# Patient Record
Sex: Male | Born: 1955 | Race: White | Hispanic: No | Marital: Married | Smoking: Never smoker
Health system: Southern US, Community
[De-identification: ages and names within clinical notes are randomized; demographics above are authoritative.]

## PROBLEM LIST (undated history)

## (undated) DIAGNOSIS — K635 Polyp of colon: Secondary | ICD-10-CM

## (undated) DIAGNOSIS — G8929 Other chronic pain: Secondary | ICD-10-CM

## (undated) DIAGNOSIS — E785 Hyperlipidemia, unspecified: Secondary | ICD-10-CM

## (undated) DIAGNOSIS — G4730 Sleep apnea, unspecified: Secondary | ICD-10-CM

## (undated) DIAGNOSIS — Z8669 Personal history of other diseases of the nervous system and sense organs: Secondary | ICD-10-CM

## (undated) DIAGNOSIS — R531 Weakness: Secondary | ICD-10-CM

## (undated) DIAGNOSIS — M549 Dorsalgia, unspecified: Secondary | ICD-10-CM

## (undated) DIAGNOSIS — K219 Gastro-esophageal reflux disease without esophagitis: Secondary | ICD-10-CM

## (undated) DIAGNOSIS — C801 Malignant (primary) neoplasm, unspecified: Secondary | ICD-10-CM

## (undated) HISTORY — DX: Polyp of colon: K63.5

## (undated) HISTORY — PX: COLONOSCOPY: SHX174

---

## 1988-08-12 HISTORY — PX: INGUINAL HERNIA REPAIR: SUR1180

## 2006-08-12 DIAGNOSIS — K635 Polyp of colon: Secondary | ICD-10-CM

## 2006-08-12 HISTORY — PX: COLON RESECTION: SHX5231

## 2006-08-12 HISTORY — DX: Polyp of colon: K63.5

## 2007-08-13 HISTORY — PX: OTHER SURGICAL HISTORY: SHX169

## 2010-08-12 HISTORY — PX: OTHER SURGICAL HISTORY: SHX169

## 2014-08-31 ENCOUNTER — Other Ambulatory Visit: Payer: Self-pay

## 2014-08-31 ENCOUNTER — Other Ambulatory Visit: Payer: Self-pay | Admitting: Neurosurgery

## 2014-09-01 ENCOUNTER — Telehealth: Payer: Self-pay | Admitting: Vascular Surgery

## 2014-09-01 NOTE — Telephone Encounter (Deleted)
-----   Message from Sherrye Payor, RN sent at 08/31/2014  3:28 PM EST ----- Regarding: needs consult with TFE Please schedule this pt. for a consult with Dr. Donnetta Hutching, prior to ALIF sched. for 09/23/14; please remind pt. to bring a CD ROM of his LS spine film to the consultation appt.

## 2014-09-01 NOTE — Telephone Encounter (Addendum)
-----   Message from Sherrye Payor, RN sent at 08/31/2014  3:28 PM EST ----- Regarding: needs consult with TFE Please schedule this pt. for a consult with Dr. Donnetta Hutching, prior to ALIF sched. for 09/23/14; please remind pt. to bring a CD ROM of his LS spine film to the consultation appt.  notified patient of appt. with dr. early on 09-14-14 at 2:45 and reminded him to bring CD ROM of l/s spine

## 2014-09-13 ENCOUNTER — Encounter: Payer: Self-pay | Admitting: Vascular Surgery

## 2014-09-14 ENCOUNTER — Encounter: Payer: Self-pay | Admitting: Vascular Surgery

## 2014-09-14 ENCOUNTER — Ambulatory Visit (INDEPENDENT_AMBULATORY_CARE_PROVIDER_SITE_OTHER): Payer: BLUE CROSS/BLUE SHIELD | Admitting: Vascular Surgery

## 2014-09-14 VITALS — BP 149/94 | HR 71 | Resp 18 | Ht 77.0 in | Wt 217.7 lb

## 2014-09-14 DIAGNOSIS — M5136 Other intervertebral disc degeneration, lumbar region: Secondary | ICD-10-CM

## 2014-09-14 NOTE — Progress Notes (Signed)
Patient name: Leon Robinson MRN: [id number] DOB: Dec 04, 1955 Sex: male   Referred by: Vertell Limber  Reason for referral:  Chief Complaint  Patient presents with  . New Evaluation    ALIF consult    HISTORY OF PRESENT ILLNESS: The patient presents today for discussion of anterior exposure for L5-S1 disc surgery. He is a very pleasant 59 year old gentleman with severe degenerative disc disease. He is status post posterior L5-S1 disc surgery in 2012. He has had recurrent disc issues with pain and atrophy in his left leg related to S1 root compression. He is scheduled for anterior exposure for disc surgery on 09/23/2014. Past history is also significant for colon cancer. He presented with blood in the stool and evaluation revealed a polyp. This was resected and then he subsequently underwent formal colon resection showing no evidence of disease. This was in 2008 with no evidence of recurrence. He does have a midline lower incision for this surgery. He has no history of cardiac disease and no history of peripheral vascular occlusive disease.  Past Medical History  Diagnosis Date  . Colon polyps 2008    cancerous colon polyps     Past Surgical History  Procedure Laterality Date  . Deviated septum repair  2009  . Colon resection  2008  . Spinal decompression surgery  2012    L5, S1  . Inguinal hernia repair  1990    History   Social History  . Marital Status: Married    Spouse Name: N/A    Number of Children: N/A  . Years of Education: N/A   Occupational History  . Not on file.   Social History Main Topics  . Smoking status: Never Smoker   . Smokeless tobacco: Not on file  . Alcohol Use: Yes     Comment: occassional social drinker  . Drug Use: No  . Sexual Activity: Not on file   Other Topics Concern  . Not on file   Social History Narrative  . No narrative on file    Family History  Problem Relation Age of Onset  . Heart disease Mother   . Hyperlipidemia Mother    . Hypertension Mother   . Diabetes Father   . Heart disease Father   . Hyperlipidemia Father   . Cancer Sister     Allergies as of 09/14/2014 - Review Complete 09/14/2014  Allergen Reaction Noted  . Dilaudid [hydromorphone hcl] Nausea And Vomiting 09/13/2014    No current outpatient prescriptions on file prior to visit.   No current facility-administered medications on file prior to visit.     REVIEW OF SYSTEMS:  Positives indicated with an "X"  CARDIOVASCULAR:  [ ]  chest pain   [ ]  chest pressure   [ ]  palpitations   [ ]  orthopnea   [ ]  dyspnea on exertion   [ ]  claudication   [ ]  rest pain   [ ]  DVT   [ ]  phlebitis PULMONARY:   [ ]  productive cough   [ ]  asthma   [ ]  wheezing NEUROLOGIC:   [ ]  weakness  [ ]  paresthesias  [ ]  aphasia  [ ]  amaurosis  [ ]  dizziness HEMATOLOGIC:   [ ]  bleeding problems   [ ]  clotting disorders MUSCULOSKELETAL:  [ ]  joint pain   [ ]  joint swelling GASTROINTESTINAL: [ ]   blood in stool  [ ]   hematemesis GENITOURINARY:  [ ]   dysuria  [ ]   hematuria PSYCHIATRIC:  [ ]  history  of major depression INTEGUMENTARY:  [ ]  rashes  [ ]  ulcers CONSTITUTIONAL:  [ ]  fever   [ ]  chills  PHYSICAL EXAMINATION:  General: The patient is a well-nourished male, in no acute distress. Vital signs are BP 149/94 mmHg  Pulse 71  Resp 18  Ht 6\' 5"  (1.956 m)  Wt 217 lb 11.2 oz (98.748 kg)  BMI 25.81 kg/m2 Pulmonary: There is a good air exchange bilaterally  Abdomen: Soft and non-tender. No masses noted. Does have a well-healed lower midline incision from his umbilicus to his perineum. Musculoskeletal: There are no major deformities.  There is no significant extremity pain. Neurologic: No focal weakness or paresthesias are detected, Skin: There are no ulcer or rashes noted. Psychiatric: The patient has normal affect. Cardiovascular: 2+ radial and 2+ dorsalis pedis pulses bilaterally   I did review his MRI which does not show any significant occlusive disease of  his iliac arteries   Impression and Plan:  L5 S1 disc disease with plan for anterior exposure. I did explain my role for anterior exposure. Explain mobilization of the rectus muscle, left ureter, intraperitoneal contents, iliac arteries and veins. I did explain the potential for injury to all these. Also explained the potential for a retrograde ejaculation. I also explained that with his prior colon surgery and with his prior L5-S1 disc disease that it is variable is amount of scarring is present tissue making injury somewhat more possible and surgery somewhat more difficult. I do feel that he is an acceptable risk for surgery. All his questions were answered and will plan on proceeding with surgery as planned on 09/23/2014 with Dr. Vertell Limber    Taniya Dasher [facility] Office: [phone]

## 2014-09-15 ENCOUNTER — Encounter (HOSPITAL_COMMUNITY): Payer: Self-pay

## 2014-09-15 ENCOUNTER — Encounter (HOSPITAL_COMMUNITY)
Admission: RE | Admit: 2014-09-15 | Discharge: 2014-09-15 | Disposition: A | Discharge status: Home / Self Care | Payer: BLUE CROSS/BLUE SHIELD | Source: Ambulatory Visit | Attending: Neurosurgery | Admitting: Neurosurgery

## 2014-09-15 DIAGNOSIS — Z0183 Encounter for blood typing: Secondary | ICD-10-CM | POA: Insufficient documentation

## 2014-09-15 DIAGNOSIS — Z01812 Encounter for preprocedural laboratory examination: Secondary | ICD-10-CM | POA: Insufficient documentation

## 2014-09-15 DIAGNOSIS — M4310 Spondylolisthesis, site unspecified: Secondary | ICD-10-CM | POA: Diagnosis not present

## 2014-09-15 DIAGNOSIS — M47896 Other spondylosis, lumbar region: Secondary | ICD-10-CM | POA: Insufficient documentation

## 2014-09-15 HISTORY — DX: Gastro-esophageal reflux disease without esophagitis: K21.9

## 2014-09-15 HISTORY — DX: Personal history of other diseases of the nervous system and sense organs: Z86.69

## 2014-09-15 HISTORY — DX: Malignant (primary) neoplasm, unspecified: C80.1

## 2014-09-15 HISTORY — DX: Other chronic pain: G89.29

## 2014-09-15 HISTORY — DX: Weakness: R53.1

## 2014-09-15 HISTORY — DX: Sleep apnea, unspecified: G47.30

## 2014-09-15 HISTORY — DX: Hyperlipidemia, unspecified: E78.5

## 2014-09-15 HISTORY — DX: Dorsalgia, unspecified: M54.9

## 2014-09-15 LAB — BASIC METABOLIC PANEL
Anion gap: 7 (ref 5–15)
BUN: 20 mg/dL (ref 6–23)
CO2: 28 mmol/L (ref 19–32)
Calcium: 9.8 mg/dL (ref 8.4–10.5)
Chloride: 106 mmol/L (ref 96–112)
Creatinine, Ser: 1.12 mg/dL (ref 0.50–1.35)
GFR calc non Af Amer: 71 mL/min — ABNORMAL LOW (ref >90)
GFR, EST AFRICAN AMERICAN: 82 mL/min — AB (ref >90)
GLUCOSE: 101 mg/dL — AB (ref 70–99)
POTASSIUM: 4.3 mmol/L (ref 3.5–5.1)
Sodium: 141 mmol/L (ref 135–145)

## 2014-09-15 LAB — CBC
HEMATOCRIT: 44.7 % (ref 39.0–52.0)
Hemoglobin: 15.8 g/dL (ref 13.0–17.0)
MCH: 29.2 pg (ref 26.0–34.0)
MCHC: 35.3 g/dL (ref 30.0–36.0)
MCV: 82.6 fL (ref 78.0–100.0)
PLATELETS: 149 10*3/uL — AB (ref 150–400)
RBC: 5.41 MIL/uL (ref 4.22–5.81)
RDW: 13.2 % (ref 11.5–15.5)
WBC: 4.9 10*3/uL (ref 4.0–10.5)

## 2014-09-15 LAB — TYPE AND SCREEN
ABO/RH(D): A POS
ANTIBODY SCREEN: NEGATIVE

## 2014-09-15 LAB — SURGICAL PCR SCREEN
MRSA, PCR: NEGATIVE
Staphylococcus aureus: NEGATIVE

## 2014-09-15 LAB — ABO/RH: ABO/RH(D): A POS

## 2014-09-15 MED ORDER — CHLORHEXIDINE GLUCONATE 4 % EX LIQD
60.0000 mL | Freq: Once | CUTANEOUS | Status: DC
Start: 2014-09-16 — End: 2014-09-16

## 2014-09-15 MED ORDER — CHLORHEXIDINE GLUCONATE 4 % EX LIQD: 60.0000 mL | Freq: Once | CUTANEOUS | Status: DC

## 2014-09-15 NOTE — Progress Notes (Signed)
Pt doesn't have a cardiologist  Stress test done 3+yrs ago in [facility] [phone] request along with echo  Denies ever having a heart cath  EKG and CXR to be requested from [facility]

## 2014-09-15 NOTE — Pre-Procedure Instructions (Signed)
Leon Robinson  09/15/2014   Your procedure is scheduled on:  Fri, Feb 12 @ 7:30 AM  Report to [facility]  at 5:30 AM.  Call this number if you have problems the morning of surgery: [phone]   Remember:   Do not eat food or drink liquids after midnight.   Take these medicines the morning of surgery with A SIP OF WATER: Cymbalta(Duloxetine) and Aciphex(Rabeprazole)              Stop taking your Aspirin. No Goody's,BC's,Aleve,Ibuprofen,Fish Oil,or any Herbal Medications   Do not wear jewelry  Do not wear lotions, powders, or colognes. You may wear deodorant.  Men may shave face and neck.  Do not bring valuables to the hospital.  [facility] is not responsible                  for any belongings or valuables.               Contacts, dentures or bridgework may not be worn into surgery.  Leave suitcase in the car. After surgery it may be brought to your room.  For patients admitted to the hospital, discharge time is determined by your                treatment team.               Patients discharged the day of surgery will not be allowed to drive  home.    Special Instructions: Shower using CHG 2 nights before surgery and the night before surgery.  If you shower the day of surgery use CHG.  Use special wash - you have one bottle of CHG for all showers.  You should use approximately 1/3 of the bottle for each shower.   Please read over the following fact sheets that you were given: Pain Booklet, Coughing and Deep Breathing, Blood Transfusion Information, MRSA Information and Surgical Site Infection Prevention

## 2014-09-15 NOTE — Progress Notes (Signed)
[facility] is where sleep study was done about 16yrs ago

## 2014-09-15 NOTE — Progress Notes (Deleted)
Medical Md is Dr.Richard Minna Antis

## 2014-09-20 NOTE — Progress Notes (Signed)
Spoke with Dr [facility] office in [place] [phone] and re-requested medical records be sent via fax as they stated they put the records in the mail. ( CXR, EKG, ECHO, stress test sleep study, OV notes)

## 2014-09-22 MED ORDER — CEFAZOLIN SODIUM-DEXTROSE 2-3 GM-% IV SOLR
2.0000 g | INTRAVENOUS | Status: AC
  Administered 2014-09-23 (×2): 2 g via INTRAVENOUS
  Filled 2014-09-22: qty 50

## 2014-09-23 ENCOUNTER — Inpatient Hospital Stay (HOSPITAL_COMMUNITY): Payer: BLUE CROSS/BLUE SHIELD

## 2014-09-23 ENCOUNTER — Inpatient Hospital Stay (HOSPITAL_COMMUNITY): Payer: BLUE CROSS/BLUE SHIELD | Admitting: Anesthesiology

## 2014-09-23 ENCOUNTER — Encounter (HOSPITAL_COMMUNITY)
Admission: RE | Disposition: A | Discharge status: Home / Self Care | Payer: BLUE CROSS/BLUE SHIELD | Source: Ambulatory Visit | Attending: Neurosurgery

## 2014-09-23 ENCOUNTER — Inpatient Hospital Stay (HOSPITAL_COMMUNITY)
Admission: RE | Admit: 2014-09-23 | Discharge: 2014-09-25 | DRG: 454 | Disposition: A | Discharge status: Home / Self Care | Payer: BLUE CROSS/BLUE SHIELD | Source: Ambulatory Visit | Attending: Neurosurgery | Admitting: Neurosurgery

## 2014-09-23 ENCOUNTER — Encounter (HOSPITAL_COMMUNITY): Payer: Self-pay | Admitting: General Practice

## 2014-09-23 DIAGNOSIS — M4005 Postural kyphosis, thoracolumbar region: Secondary | ICD-10-CM | POA: Diagnosis present

## 2014-09-23 DIAGNOSIS — M5127 Other intervertebral disc displacement, lumbosacral region: Principal | ICD-10-CM | POA: Diagnosis present

## 2014-09-23 DIAGNOSIS — M47817 Spondylosis without myelopathy or radiculopathy, lumbosacral region: Secondary | ICD-10-CM | POA: Diagnosis present

## 2014-09-23 DIAGNOSIS — Y658 Other specified misadventures during surgical and medical care: Secondary | ICD-10-CM | POA: Diagnosis not present

## 2014-09-23 DIAGNOSIS — I9752 Accidental puncture and laceration of a circulatory system organ or structure during other procedure: Secondary | ICD-10-CM | POA: Diagnosis not present

## 2014-09-23 DIAGNOSIS — M4807 Spinal stenosis, lumbosacral region: Secondary | ICD-10-CM | POA: Diagnosis present

## 2014-09-23 DIAGNOSIS — M4317 Spondylolisthesis, lumbosacral region: Secondary | ICD-10-CM

## 2014-09-23 DIAGNOSIS — M4326 Fusion of spine, lumbar region: Secondary | ICD-10-CM

## 2014-09-23 DIAGNOSIS — S35516A Injury of unspecified iliac vein, initial encounter: Secondary | ICD-10-CM

## 2014-09-23 DIAGNOSIS — M5489 Other dorsalgia: Secondary | ICD-10-CM | POA: Diagnosis present

## 2014-09-23 DIAGNOSIS — M5117 Intervertebral disc disorders with radiculopathy, lumbosacral region: Secondary | ICD-10-CM | POA: Diagnosis present

## 2014-09-23 HISTORY — PX: ANTERIOR LUMBAR FUSION: SHX1170

## 2014-09-23 HISTORY — PX: ABDOMINAL EXPOSURE: SHX5708

## 2014-09-23 SURGERY — ANTERIOR LUMBAR FUSION 1 LEVEL
Anesthesia: General | Site: Back

## 2014-09-23 MED ORDER — MORPHINE SULFATE 2 MG/ML IJ SOLN
1.0000 mg | INTRAMUSCULAR | Status: DC | PRN
  Administered 2014-09-23 – 2014-09-24 (×9): 2 mg via INTRAVENOUS
  Administered 2014-09-24: 4 mg via INTRAVENOUS
  Filled 2014-09-23 (×6): qty 1
  Filled 2014-09-23: qty 2
  Filled 2014-09-23 (×2): qty 1

## 2014-09-23 MED ORDER — BISACODYL 10 MG RE SUPP: 10.0000 mg | Freq: Every day | RECTAL | Status: DC | PRN

## 2014-09-23 MED ORDER — DEXAMETHASONE SODIUM PHOSPHATE 10 MG/ML IJ SOLN
INTRAMUSCULAR | Status: DC | PRN
  Administered 2014-09-23: 10 mg via INTRAVENOUS

## 2014-09-23 MED ORDER — PROPOFOL 10 MG/ML IV BOLUS
INTRAVENOUS | Status: DC | PRN
  Administered 2014-09-23: 200 mg via INTRAVENOUS

## 2014-09-23 MED ORDER — OXYCODONE-ACETAMINOPHEN 5-325 MG PO TABS
1.0000 | ORAL_TABLET | ORAL | Status: DC | PRN
  Administered 2014-09-25 (×2): 2 via ORAL
  Filled 2014-09-23 (×2): qty 2

## 2014-09-23 MED ORDER — ACETAMINOPHEN 325 MG PO TABS: 650.0000 mg | ORAL_TABLET | ORAL | Status: DC | PRN

## 2014-09-23 MED ORDER — GLYCOPYRROLATE 0.2 MG/ML IJ SOLN
INTRAMUSCULAR | Status: AC
  Filled 2014-09-23: qty 1

## 2014-09-23 MED ORDER — LIDOCAINE-EPINEPHRINE 1 %-1:100000 IJ SOLN
INTRAMUSCULAR | Status: DC | PRN
  Administered 2014-09-23: 5 mL

## 2014-09-23 MED ORDER — SODIUM CHLORIDE 0.9 % IJ SOLN: 3.0000 mL | INTRAMUSCULAR | Status: DC | PRN

## 2014-09-23 MED ORDER — SODIUM CHLORIDE 0.9 % IJ SOLN
3.0000 mL | Freq: Two times a day (BID) | INTRAMUSCULAR | Status: DC
  Administered 2014-09-23 – 2014-09-24 (×2): 3 mL via INTRAVENOUS

## 2014-09-23 MED ORDER — MENTHOL 3 MG MT LOZG: 1.0000 | LOZENGE | OROMUCOSAL | Status: DC | PRN

## 2014-09-23 MED ORDER — MIDAZOLAM HCL 5 MG/5ML IJ SOLN
INTRAMUSCULAR | Status: DC | PRN
  Administered 2014-09-23: 2 mg via INTRAVENOUS

## 2014-09-23 MED ORDER — CEFAZOLIN SODIUM-DEXTROSE 2-3 GM-% IV SOLR
INTRAVENOUS | Status: AC
  Filled 2014-09-23: qty 50

## 2014-09-23 MED ORDER — MIDAZOLAM HCL 2 MG/2ML IJ SOLN
INTRAMUSCULAR | Status: AC
  Filled 2014-09-23: qty 2

## 2014-09-23 MED ORDER — EPHEDRINE SULFATE 50 MG/ML IJ SOLN
INTRAMUSCULAR | Status: AC
  Filled 2014-09-23: qty 1

## 2014-09-23 MED ORDER — BUPIVACAINE HCL (PF) 0.5 % IJ SOLN
INTRAMUSCULAR | Status: DC | PRN
  Administered 2014-09-23: 5 mL

## 2014-09-23 MED ORDER — DULOXETINE HCL 60 MG PO CPEP
60.0000 mg | ORAL_CAPSULE | Freq: Every day | ORAL | Status: DC
  Administered 2014-09-23 – 2014-09-25 (×3): 60 mg via ORAL
  Filled 2014-09-23 (×3): qty 1

## 2014-09-23 MED ORDER — ONDANSETRON HCL 4 MG/2ML IJ SOLN
INTRAMUSCULAR | Status: DC | PRN
  Administered 2014-09-23: 4 mg via INTRAVENOUS

## 2014-09-23 MED ORDER — LIDOCAINE HCL (CARDIAC) 20 MG/ML IV SOLN
INTRAVENOUS | Status: DC | PRN
  Administered 2014-09-23: 60 mg via INTRAVENOUS

## 2014-09-23 MED ORDER — PANTOPRAZOLE SODIUM 40 MG PO TBEC: 40.0000 mg | DELAYED_RELEASE_TABLET | Freq: Every day | ORAL | Status: DC

## 2014-09-23 MED ORDER — SENNA 8.6 MG PO TABS
1.0000 | ORAL_TABLET | Freq: Two times a day (BID) | ORAL | Status: DC
  Administered 2014-09-24 – 2014-09-25 (×3): 8.6 mg via ORAL
  Filled 2014-09-23 (×3): qty 1

## 2014-09-23 MED ORDER — THROMBIN 20000 UNITS EX SOLR
CUTANEOUS | Status: DC | PRN
  Administered 2014-09-23: 20 mL via TOPICAL

## 2014-09-23 MED ORDER — FENTANYL CITRATE 0.05 MG/ML IJ SOLN
INTRAMUSCULAR | Status: AC
  Filled 2014-09-23: qty 5

## 2014-09-23 MED ORDER — EPHEDRINE SULFATE 50 MG/ML IJ SOLN
INTRAMUSCULAR | Status: DC | PRN
  Administered 2014-09-23 (×2): 10 mg via INTRAVENOUS
  Administered 2014-09-23: 5 mg via INTRAVENOUS
  Administered 2014-09-23: 10 mg via INTRAVENOUS
  Administered 2014-09-23: 15 mg via INTRAVENOUS

## 2014-09-23 MED ORDER — ONDANSETRON HCL 4 MG/2ML IJ SOLN: 4.0000 mg | INTRAMUSCULAR | Status: DC | PRN

## 2014-09-23 MED ORDER — PHENYLEPHRINE HCL 10 MG/ML IJ SOLN
INTRAMUSCULAR | Status: DC | PRN
  Administered 2014-09-23 (×6): 80 ug via INTRAVENOUS

## 2014-09-23 MED ORDER — PANTOPRAZOLE SODIUM 40 MG IV SOLR
40.0000 mg | Freq: Every day | INTRAVENOUS | Status: DC
  Administered 2014-09-23 – 2014-09-24 (×2): 40 mg via INTRAVENOUS
  Filled 2014-09-23 (×2): qty 40

## 2014-09-23 MED ORDER — DOCUSATE SODIUM 100 MG PO CAPS
100.0000 mg | ORAL_CAPSULE | Freq: Two times a day (BID) | ORAL | Status: DC
  Administered 2014-09-23 – 2014-09-25 (×4): 100 mg via ORAL
  Filled 2014-09-23 (×4): qty 1

## 2014-09-23 MED ORDER — PROPOFOL 10 MG/ML IV BOLUS
INTRAVENOUS | Status: AC
  Filled 2014-09-23: qty 20

## 2014-09-23 MED ORDER — ONDANSETRON HCL 4 MG/2ML IJ SOLN
4.0000 mg | INTRAMUSCULAR | Status: DC | PRN
  Administered 2014-09-23 – 2014-09-25 (×8): 4 mg via INTRAVENOUS
  Filled 2014-09-23 (×9): qty 2

## 2014-09-23 MED ORDER — LACTATED RINGERS IV SOLN
INTRAVENOUS | Status: DC | PRN
  Administered 2014-09-23 (×2): via INTRAVENOUS

## 2014-09-23 MED ORDER — FENTANYL CITRATE 0.05 MG/ML IJ SOLN
INTRAMUSCULAR | Status: DC | PRN
  Administered 2014-09-23 (×2): 50 ug via INTRAVENOUS
  Administered 2014-09-23 (×2): 100 ug via INTRAVENOUS

## 2014-09-23 MED ORDER — PHENYLEPHRINE 40 MCG/ML (10ML) SYRINGE FOR IV PUSH (FOR BLOOD PRESSURE SUPPORT)
PREFILLED_SYRINGE | INTRAVENOUS | Status: AC
  Filled 2014-09-23: qty 20

## 2014-09-23 MED ORDER — KCL IN DEXTROSE-NACL 20-5-0.45 MEQ/L-%-% IV SOLN
INTRAVENOUS | Status: DC
  Administered 2014-09-23: 17:00:00 via INTRAVENOUS
  Filled 2014-09-23: qty 1000

## 2014-09-23 MED ORDER — DEXAMETHASONE SODIUM PHOSPHATE 10 MG/ML IJ SOLN
INTRAMUSCULAR | Status: AC
  Filled 2014-09-23: qty 1

## 2014-09-23 MED ORDER — ATORVASTATIN CALCIUM 10 MG PO TABS
20.0000 mg | ORAL_TABLET | Freq: Every day | ORAL | Status: DC
  Administered 2014-09-23 – 2014-09-24 (×2): 20 mg via ORAL
  Filled 2014-09-23 (×2): qty 2

## 2014-09-23 MED ORDER — GLYCOPYRROLATE 0.2 MG/ML IJ SOLN
INTRAMUSCULAR | Status: DC | PRN
  Administered 2014-09-23: 0.2 mg via INTRAVENOUS
  Administered 2014-09-23: .8 mg via INTRAVENOUS

## 2014-09-23 MED ORDER — ALUM & MAG HYDROXIDE-SIMETH 200-200-20 MG/5ML PO SUSP: 30.0000 mL | Freq: Four times a day (QID) | ORAL | Status: DC | PRN

## 2014-09-23 MED ORDER — MENTHOL 3 MG MT LOZG
1.0000 | LOZENGE | OROMUCOSAL | Status: DC | PRN
Start: 2014-09-23 — End: 2014-09-25

## 2014-09-23 MED ORDER — DEXTROSE 5 % IV SOLN
10.0000 mg | INTRAVENOUS | Status: DC | PRN
  Administered 2014-09-23: 20 ug/min via INTRAVENOUS

## 2014-09-23 MED ORDER — ASPIRIN EC 81 MG PO TBEC
81.0000 mg | DELAYED_RELEASE_TABLET | Freq: Every day | ORAL | Status: DC
  Administered 2014-09-24 – 2014-09-25 (×2): 81 mg via ORAL
  Filled 2014-09-23 (×2): qty 1

## 2014-09-23 MED ORDER — ACETAMINOPHEN 650 MG RE SUPP: 650.0000 mg | RECTAL | Status: DC | PRN

## 2014-09-23 MED ORDER — SODIUM CHLORIDE 0.9 % IV SOLN: 250.0000 mL | INTRAVENOUS | Status: DC

## 2014-09-23 MED ORDER — NEOSTIGMINE METHYLSULFATE 10 MG/10ML IV SOLN
INTRAVENOUS | Status: AC
  Filled 2014-09-23: qty 1

## 2014-09-23 MED ORDER — FENTANYL CITRATE 0.05 MG/ML IJ SOLN
25.0000 ug | INTRAMUSCULAR | Status: DC | PRN
  Administered 2014-09-23: 50 ug via INTRAVENOUS

## 2014-09-23 MED ORDER — POLYETHYLENE GLYCOL 3350 17 G PO PACK: 17.0000 g | PACK | Freq: Every day | ORAL | Status: DC | PRN

## 2014-09-23 MED ORDER — FLEET ENEMA 7-19 GM/118ML RE ENEM: 1.0000 | ENEMA | Freq: Once | RECTAL | Status: AC | PRN

## 2014-09-23 MED ORDER — SODIUM CHLORIDE 0.9 % IJ SOLN
3.0000 mL | Freq: Two times a day (BID) | INTRAMUSCULAR | Status: DC
  Administered 2014-09-23 – 2014-09-24 (×3): 3 mL via INTRAVENOUS

## 2014-09-23 MED ORDER — PHENOL 1.4 % MT LIQD: 1.0000 | OROMUCOSAL | Status: DC | PRN

## 2014-09-23 MED ORDER — ARTIFICIAL TEARS OP OINT
TOPICAL_OINTMENT | OPHTHALMIC | Status: DC | PRN
  Administered 2014-09-23: 1 via OPHTHALMIC

## 2014-09-23 MED ORDER — 0.9 % SODIUM CHLORIDE (POUR BTL) OPTIME
TOPICAL | Status: DC | PRN
  Administered 2014-09-23: 1000 mL

## 2014-09-23 MED ORDER — ASPIRIN 81 MG PO TABS: 81.0000 mg | ORAL_TABLET | Freq: Every day | ORAL | Status: DC

## 2014-09-23 MED ORDER — LIDOCAINE HCL (CARDIAC) 20 MG/ML IV SOLN
INTRAVENOUS | Status: AC
  Filled 2014-09-23: qty 5

## 2014-09-23 MED ORDER — ROCURONIUM BROMIDE 100 MG/10ML IV SOLN
INTRAVENOUS | Status: DC | PRN
  Administered 2014-09-23: 10 mg via INTRAVENOUS
  Administered 2014-09-23: 20 mg via INTRAVENOUS
  Administered 2014-09-23 (×2): 10 mg via INTRAVENOUS
  Administered 2014-09-23: 20 mg via INTRAVENOUS
  Administered 2014-09-23: 40 mg via INTRAVENOUS

## 2014-09-23 MED ORDER — NEOSTIGMINE METHYLSULFATE 10 MG/10ML IV SOLN
INTRAVENOUS | Status: DC | PRN
  Administered 2014-09-23: 5 mg via INTRAVENOUS

## 2014-09-23 MED ORDER — LACTATED RINGERS IV SOLN
INTRAVENOUS | Status: DC | PRN
  Administered 2014-09-23 (×3): via INTRAVENOUS

## 2014-09-23 MED ORDER — SODIUM CHLORIDE 0.9 % IJ SOLN
INTRAMUSCULAR | Status: AC
  Filled 2014-09-23: qty 10

## 2014-09-23 MED ORDER — CEFAZOLIN SODIUM 1-5 GM-% IV SOLN: 1.0000 g | Freq: Three times a day (TID) | INTRAVENOUS | Status: DC

## 2014-09-23 MED ORDER — FENTANYL CITRATE 0.05 MG/ML IJ SOLN
INTRAMUSCULAR | Status: AC
  Filled 2014-09-23: qty 2

## 2014-09-23 MED ORDER — ZOLPIDEM TARTRATE 5 MG PO TABS: 5.0000 mg | ORAL_TABLET | Freq: Every evening | ORAL | Status: DC | PRN

## 2014-09-23 MED ORDER — ROCURONIUM BROMIDE 50 MG/5ML IV SOLN
INTRAVENOUS | Status: AC
  Filled 2014-09-23: qty 1

## 2014-09-23 MED ORDER — MORPHINE SULFATE 2 MG/ML IJ SOLN
INTRAMUSCULAR | Status: AC
  Filled 2014-09-23: qty 1

## 2014-09-23 MED ORDER — HYDROCODONE-ACETAMINOPHEN 5-325 MG PO TABS: 1.0000 | ORAL_TABLET | ORAL | Status: DC | PRN

## 2014-09-23 MED ORDER — ROCURONIUM BROMIDE 50 MG/5ML IV SOLN
INTRAVENOUS | Status: AC
  Filled 2014-09-23: qty 2

## 2014-09-23 MED ORDER — DIAZEPAM 5 MG PO TABS
5.0000 mg | ORAL_TABLET | Freq: Four times a day (QID) | ORAL | Status: DC | PRN
  Administered 2014-09-23 – 2014-09-25 (×7): 5 mg via ORAL
  Filled 2014-09-23 (×7): qty 1

## 2014-09-23 MED ORDER — CEFAZOLIN SODIUM 1-5 GM-% IV SOLN
1.0000 g | Freq: Three times a day (TID) | INTRAVENOUS | Status: AC
  Administered 2014-09-23 – 2014-09-24 (×2): 1 g via INTRAVENOUS
  Filled 2014-09-23 (×2): qty 50

## 2014-09-23 MED ORDER — ONDANSETRON HCL 4 MG/2ML IJ SOLN
INTRAMUSCULAR | Status: AC
  Filled 2014-09-23: qty 2

## 2014-09-23 MED ORDER — GLYCOPYRROLATE 0.2 MG/ML IJ SOLN
INTRAMUSCULAR | Status: AC
  Filled 2014-09-23: qty 2

## 2014-09-23 SURGICAL SUPPLY — 117 items
APPLIER CLIP 11 MED OPEN (CLIP) ×8
BENZOIN TINCTURE PRP APPL 2/3 (GAUZE/BANDAGES/DRESSINGS) ×4 IMPLANT
BLADE CLIPPER SURG (BLADE) IMPLANT
BUR BARREL STRAIGHT FLUTE 4.0 (BURR) ×4 IMPLANT
BUR MATCHSTICK NEURO 3.0 LAGG (BURR) ×4 IMPLANT
BUR PRECISION FLUTE 5.0 (BURR) ×4 IMPLANT
CAGE HYPERLORD BRIGADE 8X38X28 (Cage) ×4 IMPLANT
CANISTER SUCT 3000ML PPV (MISCELLANEOUS) ×8 IMPLANT
CLIP APPLIE 11 MED OPEN (CLIP) ×4 IMPLANT
CLOSURE WOUND 1/2 X4 (GAUZE/BANDAGES/DRESSINGS) ×1
CONT SPEC 4OZ CLIKSEAL STRL BL (MISCELLANEOUS) ×8 IMPLANT
COVER BACK TABLE 24X17X13 BIG (DRAPES) IMPLANT
COVER BACK TABLE 60X90IN (DRAPES) ×4 IMPLANT
DECANTER SPIKE VIAL GLASS SM (MISCELLANEOUS) ×8 IMPLANT
DRAPE C-ARM 42X72 X-RAY (DRAPES) ×28 IMPLANT
DRAPE C-ARMOR (DRAPES) ×4 IMPLANT
DRAPE INCISE IOBAN 66X45 STRL (DRAPES) IMPLANT
DRAPE LAPAROTOMY 100X72X124 (DRAPES) ×8 IMPLANT
DRAPE POUCH INSTRU U-SHP 10X18 (DRAPES) ×8 IMPLANT
DRAPE SURG 17X23 STRL (DRAPES) ×4 IMPLANT
DRSG OPSITE POSTOP 4X6 (GAUZE/BANDAGES/DRESSINGS) ×8 IMPLANT
DRSG TELFA 3X8 NADH (GAUZE/BANDAGES/DRESSINGS) ×4 IMPLANT
DURAPREP 26ML APPLICATOR (WOUND CARE) ×8 IMPLANT
ELECT BLADE 4.0 EZ CLEAN MEGAD (MISCELLANEOUS) ×8
ELECT REM PT RETURN 9FT ADLT (ELECTROSURGICAL) ×8
ELECTRODE BLDE 4.0 EZ CLN MEGD (MISCELLANEOUS) ×4 IMPLANT
ELECTRODE REM PT RTRN 9FT ADLT (ELECTROSURGICAL) ×4 IMPLANT
EVACUATOR 1/8 PVC DRAIN (DRAIN) ×4 IMPLANT
GAUZE SPONGE 4X4 12PLY STRL (GAUZE/BANDAGES/DRESSINGS) ×4 IMPLANT
GAUZE SPONGE 4X4 16PLY XRAY LF (GAUZE/BANDAGES/DRESSINGS) IMPLANT
GLOVE BIO SURGEON STRL SZ8 (GLOVE) ×12 IMPLANT
GLOVE BIOGEL PI IND STRL 7.5 (GLOVE) ×2 IMPLANT
GLOVE BIOGEL PI IND STRL 8 (GLOVE) ×4 IMPLANT
GLOVE BIOGEL PI IND STRL 8.5 (GLOVE) ×6 IMPLANT
GLOVE BIOGEL PI INDICATOR 7.5 (GLOVE) ×2
GLOVE BIOGEL PI INDICATOR 8 (GLOVE) ×4
GLOVE BIOGEL PI INDICATOR 8.5 (GLOVE) ×6
GLOVE ECLIPSE 7.5 STRL STRAW (GLOVE) ×4 IMPLANT
GLOVE ECLIPSE 8.0 STRL XLNG CF (GLOVE) ×8 IMPLANT
GLOVE EXAM NITRILE LRG STRL (GLOVE) IMPLANT
GLOVE EXAM NITRILE MD LF STRL (GLOVE) IMPLANT
GLOVE EXAM NITRILE XL STR (GLOVE) IMPLANT
GLOVE EXAM NITRILE XS STR PU (GLOVE) IMPLANT
GLOVE SS BIOGEL STRL SZ 7.5 (GLOVE) ×2 IMPLANT
GLOVE SUPERSENSE BIOGEL SZ 7.5 (GLOVE) ×2
GOWN STRL NON-REIN LRG LVL3 (GOWN DISPOSABLE) ×4 IMPLANT
GOWN STRL REUS W/ TWL LRG LVL3 (GOWN DISPOSABLE) ×4 IMPLANT
GOWN STRL REUS W/ TWL XL LVL3 (GOWN DISPOSABLE) ×12 IMPLANT
GOWN STRL REUS W/TWL 2XL LVL3 (GOWN DISPOSABLE) ×12 IMPLANT
GOWN STRL REUS W/TWL LRG LVL3 (GOWN DISPOSABLE) ×4
GOWN STRL REUS W/TWL XL LVL3 (GOWN DISPOSABLE) ×12
INSERT FOGARTY 61MM (MISCELLANEOUS) IMPLANT
INSERT FOGARTY SM (MISCELLANEOUS) IMPLANT
KIT BASIN OR (CUSTOM PROCEDURE TRAY) ×8 IMPLANT
KIT INFUSE MEDIUM (Orthopedic Implant) ×4 IMPLANT
KIT POSITION SURG JACKSON T1 (MISCELLANEOUS) ×4 IMPLANT
KIT ROOM TURNOVER OR (KITS) ×12 IMPLANT
LIQUID BAND (GAUZE/BANDAGES/DRESSINGS) ×8 IMPLANT
LOOP VESSEL MAXI BLUE (MISCELLANEOUS) IMPLANT
LOOP VESSEL MINI RED (MISCELLANEOUS) IMPLANT
MILL MEDIUM DISP (BLADE) ×4 IMPLANT
NEEDLE ASP BONE MRW 11GX15 J (NEEDLE) ×4 IMPLANT
NEEDLE HYPO 25X1 1.5 SAFETY (NEEDLE) ×8 IMPLANT
NEEDLE SPNL 18GX3.5 QUINCKE PK (NEEDLE) ×8 IMPLANT
NS IRRIG 1000ML POUR BTL (IV SOLUTION) ×8 IMPLANT
PACK LAMINECTOMY NEURO (CUSTOM PROCEDURE TRAY) ×8 IMPLANT
PAD ARMBOARD 7.5X6 YLW CONV (MISCELLANEOUS) ×20 IMPLANT
PATTIES SURGICAL .5 X.5 (GAUZE/BANDAGES/DRESSINGS) IMPLANT
PATTIES SURGICAL .5 X1 (DISPOSABLE) IMPLANT
PATTIES SURGICAL 1X1 (DISPOSABLE) IMPLANT
ROD RELINE-O LORD 5.5X35MM (Rod) ×4 IMPLANT
SCREW 25MM (Screw) ×8 IMPLANT
SCREW LOCK RELINE 5.5 TULIP (Screw) ×8 IMPLANT
SCREW RELINE-O POLY 6.5X50MM (Screw) ×8 IMPLANT
SPONGE INTESTINAL PEANUT (DISPOSABLE) ×8 IMPLANT
SPONGE LAP 18X18 X RAY DECT (DISPOSABLE) ×4 IMPLANT
SPONGE LAP 4X18 X RAY DECT (DISPOSABLE) IMPLANT
SPONGE SURGIFOAM ABS GEL 100 (HEMOSTASIS) ×4 IMPLANT
SPONGE SURGIFOAM ABS GEL SZ50 (HEMOSTASIS) IMPLANT
STAPLER SKIN PROX WIDE 3.9 (STAPLE) IMPLANT
STAPLER VISISTAT 35W (STAPLE) IMPLANT
STRIP BIOACTIVE VITOSS 25X100X (Neuro Prosthesis/Implant) ×8 IMPLANT
STRIP CLOSURE SKIN 1/2X4 (GAUZE/BANDAGES/DRESSINGS) ×3 IMPLANT
SUT MNCRL AB 4-0 PS2 18 (SUTURE) ×4 IMPLANT
SUT PROLENE 4 0 RB 1 (SUTURE) ×8
SUT PROLENE 4-0 RB1 .5 CRCL 36 (SUTURE) ×8 IMPLANT
SUT PROLENE 5 0 C1 (SUTURE) ×4 IMPLANT
SUT PROLENE 5 0 CC1 (SUTURE) ×4 IMPLANT
SUT PROLENE 6 0 C 1 30 (SUTURE) ×4 IMPLANT
SUT PROLENE 6 0 CC (SUTURE) IMPLANT
SUT SILK 0 TIES 10X30 (SUTURE) ×4 IMPLANT
SUT SILK 2 0 TIES 10X30 (SUTURE) ×4 IMPLANT
SUT SILK 2 0 TIES 17X18 (SUTURE) ×2
SUT SILK 2 0SH CR/8 30 (SUTURE) IMPLANT
SUT SILK 2-0 18XBRD TIE BLK (SUTURE) ×2 IMPLANT
SUT SILK 3 0 TIES 10X30 (SUTURE) ×4 IMPLANT
SUT SILK 3 0SH CR/8 30 (SUTURE) IMPLANT
SUT VIC AB 0 CT1 27 (SUTURE)
SUT VIC AB 0 CT1 27XBRD ANBCTR (SUTURE) IMPLANT
SUT VIC AB 1 CT1 18XBRD ANBCTR (SUTURE) ×8 IMPLANT
SUT VIC AB 1 CT1 8-18 (SUTURE) ×8
SUT VIC AB 2-0 CT1 18 (SUTURE) ×12 IMPLANT
SUT VIC AB 2-0 CT1 27 (SUTURE) ×2
SUT VIC AB 2-0 CT1 27XBRD (SUTURE) ×2 IMPLANT
SUT VIC AB 2-0 CT1 36 (SUTURE) ×16 IMPLANT
SUT VIC AB 3-0 SH 27 (SUTURE) ×2
SUT VIC AB 3-0 SH 27X BRD (SUTURE) ×2 IMPLANT
SUT VIC AB 3-0 SH 8-18 (SUTURE) ×16 IMPLANT
SUT VICRYL 4-0 PS2 18IN ABS (SUTURE) IMPLANT
SYR 20ML ECCENTRIC (SYRINGE) ×8 IMPLANT
SYR 3ML LL SCALE MARK (SYRINGE) ×8 IMPLANT
SYR 5ML LL (SYRINGE) IMPLANT
TOWEL OR 17X24 6PK STRL BLUE (TOWEL DISPOSABLE) ×12 IMPLANT
TOWEL OR 17X26 10 PK STRL BLUE (TOWEL DISPOSABLE) ×8 IMPLANT
TRAP SPECIMEN MUCOUS 40CC (MISCELLANEOUS) ×4 IMPLANT
TRAY FOLEY CATH 14FRSI W/METER (CATHETERS) ×4 IMPLANT
WATER STERILE IRR 1000ML POUR (IV SOLUTION) ×8 IMPLANT

## 2014-09-23 NOTE — Progress Notes (Signed)
UR complete.  Adaja Wander RN, MSN 

## 2014-09-23 NOTE — Op Note (Signed)
09/23/2014  1:36 PM  PATIENT:  Leon Robinson  59 y.o. male  PRE-OPERATIVE DIAGNOSIS:  Lumbar radiculopathy, Retrolisthesis of vertebrae, Spondylosis without myelopathy, Stenosis, kyphotic spinal deformity  POST-OPERATIVE DIAGNOSIS:  Lumbar radiculopathy, Retrolisthesis of vertebrae, Spondylosis without myelopathy, Stenosis, kyphotic spinal deformity  PROCEDURE:  Procedure(s) with comments: L5-S1 Anterior Lumbar interbody fusion (Dr. Donnetta Hutching for approach) with  (N/A) - L5-S1 Anterior Lumbar interbody fusion with Posterior redo decompression/Left facetectomy/Rt side pedicle screws and rod with posterolateral arthrodesis Posterior redo decompression/Left facetectomy/Rt side pedicle screws and rod (N/A) - Posterior redo decompression/Left facetectomy/Rt side pedicle screws and rod  Correction of kyphotic spinal deformity  ABDOMINAL EXPOSURE (N/A) - ABDOMINAL EXPOSURE  SURGEON:  Surgeon(s) and Role: Panel 1:    * Erline Levine, MD - Primary    * Angelia Mould, MD - Assisting  Panel 2:    * Rosetta Posner, MD - Primary  PHYSICIAN ASSISTANT:   ASSISTANTS: Poteat, RN   ANESTHESIA:   general  EBL:  Total I/O In: 34 [I.V.:3300] Out: 750 [Urine:475; Blood:275]  BLOOD ADMINISTERED:none  DRAINS: none   LOCAL MEDICATIONS USED:  MARCAINE     SPECIMEN:  No Specimen  DISPOSITION OF SPECIMEN:  N/A  COUNTS:  YES  TOURNIQUET:  * No tourniquets in log *  DICTATION:   INDICATIONS:  Pateint is 59 year old male with chronic and intractable back and left lower extremity pain and weaknesswith marked disc degeneration and spondylosis who had previously undergone decompression and fusion L5 -S1 level on the left. He has retrolisthesis of L 5 S 1 with severe foraminal stenosis and kyphotic deformity. and It was elected to take him to surgery to perform ALIF at L 5 S1 with hyperlordotic cage, then to reposition the patient prone and perform posterior left facetectomy and placement of  posterior hardware.  PROCEDURE:  The patient was placed in a prone position on the OR table after the smooth and uncomplicated induction of general anesthesia. The patient was then positioned supine on the operating table.  Doctor Early performed exposure and his portion of the procedure will be dictated separately.  Upon exposing the L 5 S1 level, a localizing X ray was obtained with the C arm.  I then incised the anterior annulus and performed a thorough discectomy.  The endplates were cleared of disc and cartilagenous material and a thorough discectomy was performed with decompression of the ventral annulus and disc material.  After trial, a 20 degree hyperlordoticx 10 mm Nuvasive PEEK spacer was selected, packed with medium BMP, which was divided between anterior and posterior surgery, and Vitoss  which was reconstituted with marrow rich blood aspirated from the L 5 vertebral body.  The implant was tamped into position and positioning was confirmed with C arm.  25 mm screws were affixed to the L 5 and S1 vertebrae using . Locking mechanisms were engaged, soft tissues were inspected and found to be in good repair.  Retractors were removed. There was some bleeding from near the iliac vein, which Dr. Scot Dock repaired.   Fascia was closed with 0 Prolene running stitch, skin edges closed with 2-0 and 3-0 vicryl sutures. Wound was dressed with a sterile occlusive dressing.  Patient was then turned prone on the Thatcher table and, after prepping and draping his low back and infiltrating skin and subcutaneous tissues with lidocaine, the prior incision was reopened and thethe L 5 S 1 level was exposed bilaterally.  After confirming level with X ray, A c  It was locked down in situ except for L 5 S 1, which was placed in compression. A medium Hemovac drain was placed and the incision was closed with 1, 2-0, 3-0 vicryl sutures.  A sterile occlusive dressing complete facetectomy with redo decompression was performed on the  left.  The L 5 and S 1 nerve roots were thoroughly decompressed.  Pedicle screws were placed on the right (6.5 x 50 mm at L 5 and S 1 levels) and placed in compression after posterolateral area was exposed and decorticated. Posterolateral arthrodesis was performed with remaining BMP and local autograft and Vitoss.  Final radiographs demonstrated well positioned interbody grafts and hardware. The wound was irrigated, then closed with 1, 2-0, 3-0 vicryl sutures.  Sterile occlusive dressings were placed.  Patient was extubated in the OR and taken to recovery having tolerated his surgery well.  Intraoperative assessment of kyphosis correction indicated 12 degrees of correction at the L 5 S 1 level.Counts were correct at the end of the case.  PLAN OF CARE: Admit to inpatient   PATIENT DISPOSITION:  PACU - hemodynamically stable.   Delay start of Pharmacological VTE agent (>24hrs) due to surgical blood loss or risk of bleeding: yes

## 2014-09-23 NOTE — H&P (Signed)
Patient ID:   [id number] Patient: Leon Robinson  Date of Birth: 10/29/1955 Visit Type: Office Visit   Date: 08/31/2014 10:30 AM Provider: Marchia Meiers. Vertell Limber MD   This 59 year old male presents for back pain and numbness.  History of Present Illness: 1.  back pain  2.  numbness  Leon Robinson, 59 year old retired male, visits for evaluation of lumbar, left buttock, left leg pain, numbness, and weakness.  He reports 25 year history of lumbar pain, usually relieved by activity modification and over-the-counter medications.  In 2012 he had posterior L5-S1 decompression by Dr. Bland Span at [facility]  Left leg numbness and weakness continued to progress as of 2 years ago.  He returned to [facility] for an opinion, obtained an opinion from Dr. Edson Snowball in [place] 5 days ago, and visits today for Dr. Melven Sartorius evaluation.  PT, Chiropractic, ESI (several) Advil is taken as needed  Imaging on Canopy  I met with the patient and discussed concerns regarding doctor shopping and getting 2 different opinions from one practice.  Apparently the patient was advised not to go to [place] prior to coming to see me but says that he wanted to get multiple opinions.  He says that he liked Dr. Edson Snowball fine and his advice as well but did not like the globus instrumentation that the patient says Dr. Edson Snowball was planning on using.  The patient has very detailed engineering background and was concerned regarding the biomechanics of the anterior lumbar implant.  Before I had met within the patient had decided that he thought the different implant would be a better option and asked me about which implants I typically use.  I told him I typically use Nuvasive ALIF cages with hyper lordotic options and screws that are incorporated into the graft.  We also had a lengthy discussion about bone graft options and Oncogenesis related to BMP usage.  I explained that I thought BMP was very effective in low dosage and that my impression of the  increased cancer risk was that this was not a substantial increase in risk.  The patient went on to describe that he has low back and left foot pain and left calf weakness and says this is been going on for the last 2 years.  He describes a greater than 20 year history of low back pain which is been getting worse and has pain radiating in the S1 distribution on the left.  He underwent decompressive surgery with Dr. Bland Span and felt that he did not have a lot of improvement after this.  He has atrophy in his left calf.  He hadn't EMG and nerve conduction velocities which show chronic ongoing denervation in the left S1 distribution as of 2014.  The patient also notes that he had a CSF leak at the time of his last surgery for which she was maintained on bed rest for approximately 24 hours.  The patient describes that he has numbness with standing and sitting and leaning forward helps him.  He did not like Dr. Juliet Rude recommendations of 2 level lumbar fusion.  He did appreciate Dr. Dallas Breeding recommendations of anterior lumbar fusion with possible pedicle screw fixation along with facetectomy.  The concern is that the patient has a grade 2 retrolisthesis of L5 on S1 with significant ongoing left S1 nerve root compression.  I expressed concern that with a facetectomy and this pre-existing instability would be most appropriate to place posterior instrumentation at the L5-S1 level, quite possibly only from the right side  particularly if a thorough facetectomy had been performed.      Medical/Surgical/Interim History Reviewed, no change.  Last detailed document date:08/26/2014.   PAST MEDICAL HISTORY, SURGICAL HISTORY, FAMILY HISTORY, SOCIAL HISTORY AND REVIEW OF SYSTEMS I have reviewed the patient's past medical, surgical, family and social history as well as the comprehensive review of systems as included on the [facility] history form dated 08/31/2014, which I have  signed.  Family History: Reviewed, no changes.  Last detailed document: 08/26/2014.   Social History: Tobacco use reviewed. Reviewed, no changes. Last detailed document date: 08/26/2014.      MEDICATIONS(added, continued or stopped this visit): Started Medication Directions Instruction Stopped   Aciphex 20 mg tablet,delayed release take 1 tablet by oral route  every day swallowing whole. Do not crush, chew and/or divide.     aspirin 81 mg chewable tablet chew 1 tablet by oral route  every day     Cymbalta 60 mg capsule,delayed release take 1 capsule by oral route  every day     Lipitor 20 mg tablet take 1 tablet by oral route  every day     multivitamin tablet take 1 tablet by oral route  every day with food      ALLERGIES:    Vitals Date Temp F BP Pulse Ht In Wt Lb BMI BSA Pain Score  08/31/2014  164/81 69 77 217 25.73  2/10     PHYSICAL EXAM General Level of Distress: no acute distress Overall Appearance: normal    Cardiovascular Cardiac: regular rate and rhythm without murmur  Respiratory Lungs: clear to auscultation  Neurological Recent and Remote Memory: normal Attention Span and Concentration:   normal Language: normal Fund of Knowledge: normal  Right Left Sensation: normal normal Upper Extremity Coordination: normal normal  Lower Extremity Coordination: normal normal  Musculoskeletal Gait and Station: normal  Right Left Upper Extremity Muscle Strength: normal normal Lower Extremity Muscle Strength: normal normal Upper Extremity Muscle Tone:  normal normal Lower Extremity Muscle Tone: normal atrophy  Motor Strength Upper and lower extremity motor strength was tested in the clinically pertinent muscles. Any abnormal findings will be noted below.   Right Left Medial Gastroc:  4+/5   Deep Tendon  Reflexes  Right Left Biceps: normal normal Triceps: normal normal Brachiloradialis: normal normal Patellar: normal normal Achilles: normal absent  Sensory Sensation was tested at L1 to S1.   Cranial Nerves II. Optic Nerve/Visual Fields: normal III. Oculomotor: normal IV. Trochlear: normal V. Trigeminal: normal VI. Abducens: normal VII. Facial: normal VIII. Acoustic/Vestibular: normal IX. Glossopharyngeal: normal X. Vagus: normal XI. Spinal Accessory: normal XII. Hypoglossal: normal  Motor and other Tests Lhermittes: negative Rhomberg: negative    Right Left Hoffman's: normal normal Clonus: normal normal Babinski: normal normal SLR: negative negative Patrick's Corky Sox): negative negative Toe Walk: normal normal Toe Lift: normal normal Heel Walk: normal normal SI Joint: nontender nontender   Additional Findings:  Patient has atrophy in his left calf.  He is decreased in sensation left S1, L5 and L4 distributions.    IMPRESSION Patient has ongoing left S1 radiculopathy with atrophy in his left and areflexia at the left ankle.  He has decreased pin sensation left S1, L5, L4 distributions.  He has decreased plantar flexion strength on the left.  I agree with Dr. Dallas Breeding recommendation of anterior lumbar interbody fusion at L5-S1 level with facetectomy on the left and have recommended supplemental pedicle screw fixation particularly on the right at the L5-S1 level.  In light of the multilevel degenerative changes in the lumbar spine I do not think that prophylactic surgery at the L4 L5 level necessarily going to stave off additional degenerative changes, particularly since there is already significant degeneration at L2-3 and the L3 L4 levels.  Assessment/Plan # Detail Type Description   1. Assessment Retrolisthesis of vertebrae (M43.10).       2. 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       3. Assessment Spinal  stenosis of lumbosacral region (M48.07).       4. Assessment Displacement of lumbosacral intervertebral disc (M51.27).       5. Assessment Lumbar radiculopathy (M54.16).         Pain Assessment/Treatment Location: back. Onset: 08/31/1998. Duration: varies. Quality: discomforting. Pain Assessment/Treatment follow-up plan of care: Patient is taking medications as prescribed..  Patient has elected to proceed with decompression via anterior lumbar interbody fusion and posterior decompression with instrumentation at the L5-S1 level.  He is fitted for a lumbo sacral orthosis and partially 45 minutes was spent in detailed cushion with the patient regarding surgical risks and benefits and going over detailed models with the patient.  He will undergo anterior approach with Dr. early and surgery has been scheduled for 09/23/14.  The patient is aware that he may achieve no improvement in left leg weakness and that his atrophy in his left calf may not improve at all.  He wants to assure that this does not get worse to the point where he is unable to walk or has to wear an AFO for leg support.  I suggested he try boots to give him some incremental support with his ankle.  Orders: Diagnostic Procedures: Assessment Procedure  M54.16 ALIF L5-S1 and posterior redo decompression with Left facetectomy and Right side pedicle screws & rod. - L5-S1             Provider:  Marchia Meiers. Vertell Limber MD  09/04/2014 01:56 PM Dictation edited by: Marchia Meiers. Vertell Limber    CC Providers: [facility] [address] [place],  [place]  [id number]-   [facility] [address] [place], [place] [address]-          ----------------------------------------------------------------------------------------------------------------------------------------------------------------------        Electronically signed by Marchia Meiers. Vertell Limber  MD on 09/04/2014 01:56 PM

## 2014-09-23 NOTE — Brief Op Note (Signed)
09/23/2014  1:36 PM  PATIENT:  Leon Robinson  59 y.o. male  PRE-OPERATIVE DIAGNOSIS:  Lumbar radiculopathy, Retrolisthesis of vertebrae, Spondylosis without myelopathy, Stenosis, kyphotic spinal deformity  POST-OPERATIVE DIAGNOSIS:  Lumbar radiculopathy, Retrolisthesis of vertebrae, Spondylosis without myelopathy, Stenosis, kyphotic spinal deformity  PROCEDURE:  Procedure(s) with comments: L5-S1 Anterior Lumbar interbody fusion (Dr. Donnetta Hutching for approach) with  (N/A) - L5-S1 Anterior Lumbar interbody fusion with Posterior redo decompression/Left facetectomy/Rt side pedicle screws and rod with posterolateral arthrodesis Posterior redo decompression/Left facetectomy/Rt side pedicle screws and rod (N/A) - Posterior redo decompression/Left facetectomy/Rt side pedicle screws and rod  Correction of kyphotic spinal deformity  ABDOMINAL EXPOSURE (N/A) - ABDOMINAL EXPOSURE  SURGEON:  Surgeon(s) and Role: Panel 1:    * Erline Levine, MD - Primary    * Angelia Mould, MD - Assisting  Panel 2:    * Rosetta Posner, MD - Primary  PHYSICIAN ASSISTANT:   ASSISTANTS: [place], RN   ANESTHESIA:   general  EBL:  Total I/O In: 55 [I.V.:3300] Out: 750 [Urine:475; Blood:275]  BLOOD ADMINISTERED:none  DRAINS: none   LOCAL MEDICATIONS USED:  MARCAINE     SPECIMEN:  No Specimen  DISPOSITION OF SPECIMEN:  N/A  COUNTS:  YES  TOURNIQUET:  * No tourniquets in log *  DICTATION:   INDICATIONS:  Pateint is 59 year old male with chronic and intractable back and left lower extremity pain and weaknesswith marked disc degeneration and spondylosis who had previously undergone decompression and fusion L5 -S1 level on the left. He has retrolisthesis of L 5 S 1 with severe foraminal stenosis and kyphotic deformity. and It was elected to take him to surgery to perform ALIF at L 5 S1 with hyperlordotic cage, then to reposition the patient prone and perform posterior left facetectomy and placement of  posterior hardware.  PROCEDURE:  The patient was placed in a prone position on the OR table after the smooth and uncomplicated induction of general anesthesia. The patient was then positioned supine on the operating table.  Doctor Early performed exposure and his portion of the procedure will be dictated separately.  Upon exposing the L 5 S1 level, a localizing X ray was obtained with the C arm.  I then incised the anterior annulus and performed a thorough discectomy.  The endplates were cleared of disc and cartilagenous material and a thorough discectomy was performed with decompression of the ventral annulus and disc material.  After trial, a 20 degree hyperlordoticx 10 mm Nuvasive PEEK spacer was selected, packed with medium BMP, which was divided between anterior and posterior surgery, and Vitoss  which was reconstituted with marrow rich blood aspirated from the L 5 vertebral body.  The implant was tamped into position and positioning was confirmed with C arm.  25 mm screws were affixed to the L 5 and S1 vertebrae using . Locking mechanisms were engaged, soft tissues were inspected and found to be in good repair.  Retractors were removed. There was some bleeding from near the iliac vein, which Dr. Scot Dock repaired.   Fascia was closed with 0 Prolene running stitch, skin edges closed with 2-0 and 3-0 vicryl sutures. Wound was dressed with a sterile occlusive dressing.  Patient was then turned prone on the [place] table and, after prepping and draping his low back and infiltrating skin and subcutaneous tissues with lidocaine, the prior incision was reopened and thethe L 5 S 1 level was exposed bilaterally.  After confirming level with X ray, A c  It was locked down in situ except for L 5 S 1, which was placed in compression. A medium Hemovac drain was placed and the incision was closed with 1, 2-0, 3-0 vicryl sutures.  A sterile occlusive dressing complete facetectomy with redo decompression was performed on the  left.  The L 5 and S 1 nerve roots were thoroughly decompressed.  Pedicle screws were placed on the right (6.5 x 50 mm at L 5 and S 1 levels) and placed in compression after posterolateral area was exposed and decorticated. Posterolateral arthrodesis was performed with remaining BMP and local autograft and Vitoss.  Final radiographs demonstrated well positioned interbody grafts and hardware. The wound was irrigated, then closed with 1, 2-0, 3-0 vicryl sutures.  Sterile occlusive dressings were placed.  Patient was extubated in the OR and taken to recovery having tolerated his surgery well.  Intraoperative assessment of kyphosis correction indicated 12 degrees of correction at the L 5 S 1 level.Counts were correct at the end of the case.  PLAN OF CARE: Admit to inpatient   PATIENT DISPOSITION:  PACU - hemodynamically stable.   Delay start of Pharmacological VTE agent (>24hrs) due to surgical blood loss or risk of bleeding: yes

## 2014-09-23 NOTE — Transfer of Care (Signed)
Immediate Anesthesia Transfer of Care Note  Patient: Leon Robinson  Procedure(s) Performed: Procedure(s) with comments: L5-S1 Anterior Lumbar interbody fusion (Dr. Donnetta Hutching for approach) with  (N/A) - L5-S1 Anterior Lumbar interbody fusion with Posterior redo decompression/Left facetectomy/Rt side pedicle screws and rod Posterior redo decompression/Left facetectomy/Rt side pedicle screws and rod (N/A) - Posterior redo decompression/Left facetectomy/Rt side pedicle screws and rod ABDOMINAL EXPOSURE (N/A) - ABDOMINAL EXPOSURE  Patient Location: PACU  Anesthesia Type:General  Level of Consciousness: awake, alert , oriented and patient cooperative  Airway & Oxygen Therapy: Patient Spontanous Breathing and Patient connected to nasal cannula oxygen  Post-op Assessment: Report given to RN and Post -op Vital signs reviewed and stable  Post vital signs: Reviewed and stable  Last Vitals:  Filed Vitals:   09/23/14 1321  BP: 125/53  Pulse: 110  Temp:   Resp: 17    Complications: No apparent anesthesia complications

## 2014-09-23 NOTE — Op Note (Signed)
    OPERATIVE REPORT  DATE OF SURGERY: 09/23/2014  PATIENT: Leon Robinson, 59 y.o. male MRN: [id number]  DOB: 1955/09/04  PRE-OPERATIVE DIAGNOSIS: Degenerative disc disease  POST-OPERATIVE DIAGNOSIS:  Same  PROCEDURE: Anterior exposure for L5-S1 degenerative disc disease  SURGEON:  Curt Jews, M.D.  Co-surgeon for the exposure: Dr. Dierdre Harness  ANESTHESIA:  Gen.  EBL: 200 ml  Total I/O In: 3000 [I.V.:3000] Out: 750 [Urine:475; Blood:275]  BLOOD ADMINISTERED: None  DRAINS: None  SPECIMEN: None  COUNTS CORRECT:  YES  PLAN OF CARE: PACU   PATIENT DISPOSITION:  PACU - hemodynamically stable  PROCEDURE DETAILS: Patient was taken to the operative placed was used. The abdomen was prepped and draped in sterile fashion. Lateral C-arm was used to identify the level of the L5-S1 disc. This was marked on the skin. The patient had a prior midline lower abdominal incision from colon resection. Incision was made several fingerbreadths below the level of the umbilicus from the midline to the left lower quadrant. The fat was divided in line with skin incision with electrocautery. That was mobilized off the anterior rectus sheath. Anterior rectus sheath was opened in line with the skin incision. The rectus muscle was mobilized circumferentially. The retroperitoneum was entered bluntly below the level of the semilunar line in the left lower quadrant. The intracranial contents were mobilized to the right. The posterior rectus sheath was divided laterally to give better exposure. Blunt dissection was continued to shift the ureter and intraperitoneal contents to the right. The left iliac vein and artery were identified and mobilization was continued between the level of the iliac arteries over the L5-S1 disc. Middle sacral vessels were clipped and divided for better mobilization. Blunt dissection extended on the L5-S1 disc. When adequate mobilization was obtained, the [facility] retractor was  brought onto the field. The reverse lip 150 blade was positioned to the right of the L5-S1 disc. Notable retractors were used to give superior and inferior retraction. A spinal needle was placed in the L5 disc and C-arm was brought back on the table to confirm that this was indeed the L5-S1 disc. The remainder the procedure will be dictated as a separate note by Dr. Lilia Argue, M.D. 09/23/2014 12:56 PM

## 2014-09-23 NOTE — Anesthesia Preprocedure Evaluation (Addendum)
Anesthesia Evaluation  Patient identified by MRN, date of birth, ID band Patient awake    Reviewed: Allergy & Precautions, NPO status , Patient's Chart, lab work & pertinent test results  Airway Mallampati: II  TM Distance: >3 FB Neck ROM: Full    Dental   Pulmonary sleep apnea ,          Cardiovascular negative cardio ROS  Rhythm:Regular Rate:Normal     Neuro/Psych    GI/Hepatic Neg liver ROS, GERD-  Medicated and Controlled,  Endo/Other  negative endocrine ROS  Renal/GU negative Renal ROS     Musculoskeletal   Abdominal   Peds  Hematology   Anesthesia Other Findings   Reproductive/Obstetrics                            Anesthesia Physical Anesthesia Plan  ASA: II  Anesthesia Plan: General   Post-op Pain Management:    Induction: Intravenous  Airway Management Planned: Oral ETT  Additional Equipment:   Intra-op Plan:   Post-operative Plan: Possible Post-op intubation/ventilation  Informed Consent: I have reviewed the patients History and Physical, chart, labs and discussed the procedure including the risks, benefits and alternatives for the proposed anesthesia with the patient or authorized representative who has indicated his/her understanding and acceptance.   Dental advisory given  Plan Discussed with: Anesthesiologist, CRNA and Surgeon  Anesthesia Plan Comments:         Anesthesia Quick Evaluation

## 2014-09-23 NOTE — Op Note (Signed)
    NAME: [name]   MRN: [id number] DOB: September 16, 1955    DATE OF OPERATION: 09/23/2014  PREOP DIAGNOSIS: bleeding from left common iliac vein  POSTOP DIAGNOSIS: same  PROCEDURE: repair of left common iliac vein  SURGEON: Judeth Cornfield. Scot Dock, MD, FACS  ASSIST: Dierdre Harness M.D.  ANESTHESIA: Gen.   EBL: 100 cc  INDICATIONS: Leon Robinson is a 59 y.o. male who had the L5-S1 disc space exposed this morning. The ALIF was then performed uneventfully. However, while removing the retractors and was significant bleeding noted in the wound and we were asked to assist.  FINDINGS: Bleeding from the proximal left common iliac vein  TECHNIQUE: The Wiley retractors were replaced to expose the L5-S1 disc space. There was significant venous bleeding which appeared to be originating from the left common iliac vein. Upon further exploration there was a branch to the proximal left iliac vein which was torn right at its confluence with the vein itself. There was not enough room to place a clip and therefore elected to repair this with a 5-0 Prolene suture. Figure-of-eight 5-0 Prolene suture was placed and at this point there was good hemostasis. The wound was irrigated. The retractors were removed.   Deitra Mayo, MD, FACS [facility]  DATE OF DICTATION:   09/23/2014

## 2014-09-23 NOTE — Progress Notes (Signed)
Awake, alert, conversant.  Incisional soreness, but otherwise doing well.  Full strength both legs "my leg feels better."

## 2014-09-23 NOTE — Interval H&P Note (Signed)
History and Physical Interval Note:  09/23/2014 7:23 AM  Leon Robinson  has presented today for surgery, with the diagnosis of Lumbar radiculopathy, Retrolisthesis of vertebrae, Spondylosis without myelopathy, Stenosis  The various methods of treatment have been discussed with the patient and family. After consideration of risks, benefits and other options for treatment, the patient has consented to  Procedure(s) with comments: L5-S1 Anterior Lumbar interbody fusion (Dr. Donnetta Hutching for approach) with  (N/A) - L5-S1 Anterior Lumbar interbody fusion with Posterior redo decompression/Left facetectomy/Rt side pedicle screws and rod Posterior redo decompression/Left facetectomy/Rt side pedicle screws and rod (N/A) ABDOMINAL EXPOSURE (N/A) as a surgical intervention .  The patient's history has been reviewed, patient examined, no change in status, stable for surgery.  I have reviewed the patient's chart and labs.  Questions were answered to the patient's satisfaction.     [name]  I have discussed with patient the potential need for bilateral facetectomies and pedicle screw fixation in an effort to further correct lordosis and sagittal balance, as current mismatch is 22 degrees.  He understands this and wishes to proceed.

## 2014-09-23 NOTE — Anesthesia Postprocedure Evaluation (Signed)
  Anesthesia Post-op Note  Patient: Leon Robinson  Procedure(s) Performed: Procedure(s) with comments: L5-S1 Anterior Lumbar interbody fusion (Dr. Donnetta Hutching for approach) with  (N/A) - L5-S1 Anterior Lumbar interbody fusion with Posterior redo decompression/Left facetectomy/Rt side pedicle screws and rod Posterior redo decompression/Left facetectomy/Rt side pedicle screws and rod (N/A) - Posterior redo decompression/Left facetectomy/Rt side pedicle screws and rod ABDOMINAL EXPOSURE (N/A) - ABDOMINAL EXPOSURE  Patient Location: PACU  Anesthesia Type:General  Level of Consciousness: awake  Airway and Oxygen Therapy: Patient Spontanous Breathing  Post-op Pain: mild  Post-op Assessment: Post-op Vital signs reviewed  Post-op Vital Signs: Reviewed  Last Vitals:  Filed Vitals:   09/23/14 1321  BP: 125/53  Pulse: 110  Temp:   Resp: 17    Complications: No apparent anesthesia complications

## 2014-09-24 NOTE — Progress Notes (Signed)
Patient ID: Leon Robinson, male   DOB: Dec 05, 1955, 59 y.o.   MRN: [id number] Doing great. C/o of some back pain not abdominal. No weakness

## 2014-09-24 NOTE — Progress Notes (Signed)
   Daily Progress Note  Assessment/Planning: POD #1 s/p ALIF S5/L1, repair L CIV   Intact bowel sound, tolerating diet  Strongly palpable pedal pulses  Available as needed  Subjective  - 1 Day Post-Op  Pain tolerable  Objective Filed Vitals:   09/23/14 1604 09/23/14 2310 09/24/14 0200 09/24/14 0600  BP: 108/63 136/59 126/63 126/66  Pulse: 88 100 99 91  Temp: 97.5 F (36.4 C) 98.3 F (36.8 C) 98 F (36.7 C) 98.1 F (36.7 C)  TempSrc: Oral Oral Oral Oral  Resp: 20 18 18 18   SpO2: 100% 100% 100% 98%    Intake/Output Summary (Last 24 hours) at 09/24/14 0816 Last data filed at 09/24/14 0630  Gross per 24 hour  Intake   3300 ml  Output   3175 ml  Net    125 ml    PULM  CTAB CV  RRR GI  soft, appropriately min TTP, -G/R, +BS VASC  Palpable pedal pulses  Laboratory CBC    Component Value Date/Time   WBC 4.9 09/15/2014 1100   HGB 15.8 09/15/2014 1100   HCT 44.7 09/15/2014 1100   PLT 149* 09/15/2014 1100    BMET    Component Value Date/Time   NA 141 09/15/2014 1100   K 4.3 09/15/2014 1100   CL 106 09/15/2014 1100   CO2 28 09/15/2014 1100   GLUCOSE 101* 09/15/2014 1100   BUN 20 09/15/2014 1100   CREATININE 1.12 09/15/2014 1100   CALCIUM 9.8 09/15/2014 1100   GFRNONAA 71* 09/15/2014 1100   GFRAA 82* 09/15/2014 [place], MD [facility] [place]: [phone] Pager: [phone]  09/24/2014, 8:16 AM

## 2014-09-24 NOTE — Progress Notes (Signed)
Occupational Therapy Evaluation Patient Details Name: Leon Robinson MRN: [id number] DOB: [birth year] Today's Date: 09/24/2014    History of Present Illness s/p ALIF S5/L1, repair L CIV   Clinical Impression   Completed all education regarding back precautions for ADL and functional mobility. Handout given. Pt overall @ mod I level with functional mobility. Will have 24/7 S after D/C. Pt ready to D/C home when medically stable. OT signing off.     Follow Up Recommendations  No OT follow up;Supervision - Intermittent    Equipment Recommendations  Tub/shower seat (pt to get if needed)    Recommendations for Other Services       Precautions / Restrictions Precautions Precautions: Back Precaution Booklet Issued: Yes (comment) Required Braces or Orthoses: Spinal Brace Spinal Brace: Lumbar corset (no order for corsett. Pt states he is to donn/doff in sitting per MD instruction)      Mobility Bed Mobility Overal bed mobility: Needs Assistance Bed Mobility: Sidelying to Sit   Sidelying to sit: Supervision       General bed mobility comments: educated on log rolling for bed mobility. Pt able to return demonstrate  Transfers Overall transfer level: Modified independent               General transfer comment: educated on back precautions for trnasfers. Able to return demonstrate    Balance Overall balance assessment: No apparent balance deficits (not formally assessed)                                          ADL Overall ADL's : Needs assistance/impaired     Grooming: Modified independent   Upper Body Bathing: Modified independent;Sitting   Lower Body Bathing: Supervison/ safety;Set up;Sit to/from stand   Upper Body Dressing : Modified independent;Sitting (able to donn/doff corsett independently)   Lower Body Dressing: Set up;Supervision/safety;Sit to/from stand   Toilet Transfer: Supervision/safety;Comfort height toilet   Toileting-  Clothing Manipulation and Hygiene: Supervision/safety;Sit to/from stand       Functional mobility during ADLs: Supervision/safety General ADL Comments: completed education regarding ADL and funcitonal mobility regarding back precautions. educated on home safety and reducing risk of falls.      Vision     Perception     Praxis      Pertinent Vitals/Pain Pain Assessment: 0-10 Pain Score: 5  Pain Location: back Pain Descriptors / Indicators: Aching Pain Intervention(s): Limited activity within patient's tolerance;Monitored during session;Repositioned     Hand Dominance Right   Extremity/Trunk Assessment Upper Extremity Assessment Upper Extremity Assessment: Overall WFL for tasks assessed   Lower Extremity Assessment Lower Extremity Assessment: Defer to PT evaluation   Cervical / Trunk Assessment Cervical / Trunk Assessment: Normal   Communication Communication Communication: No difficulties   Cognition Arousal/Alertness: Awake/alert Behavior During Therapy: WFL for tasks assessed/performed Overall Cognitive Status: Within Functional Limits for tasks assessed                     General Comments       Exercises       Shoulder Instructions      Home Living Family/patient expects to be discharged to:: Private residence Living Arrangements: Spouse/significant other Available Help at Discharge: Available 24 hours/day Type of Home: House Home Access: Stairs to enter [employer] of Steps: 4 Entrance Stairs-Rails: Left Home Layout: Two level;Able to live on main level with bedroom/bathroom  Bathroom Shower/Tub: Occupational psychologist: Handicapped height Bathroom Accessibility: Yes How Accessible: Accessible via walker Home Equipment: None          Prior Functioning/Environment Level of Independence: Independent             OT Diagnosis: Acute pain   OT Problem List: Decreased knowledge of use of DME or AE;Decreased  knowledge of precautions;Pain   OT Treatment/Interventions:      OT Goals(Current goals can be found in the care plan section) Acute Rehab OT Goals Patient Stated Goal: to go home OT Goal Formulation: All assessment and education complete, DC therapy  OT Frequency:     Barriers to D/C:            Co-evaluation              End of Session Equipment Utilized During Treatment: Gait belt;Back brace Nurse Communication: Mobility status;Precautions  Activity Tolerance: Patient tolerated treatment well Patient left: in chair;with call bell/phone within reach;with family/visitor present   Time: 1303-1330 OT Time Calculation (min): 27 min Charges:  OT General Charges $OT Visit: 1 Procedure OT Evaluation $Initial OT Evaluation Tier I: 1 Procedure OT Treatments $Self Care/Home Management : 8-22 mins G-Codes:    Bathsheba Durrett,HILLARY 18-Oct-2014, 1:59 PM   [facility], OTR/L  [phone] 2014/10/18

## 2014-09-24 NOTE — Evaluation (Signed)
Physical Therapy Evaluation Patient Details Name: Leon Robinson MRN: [id number] DOB: 1956-02-19 Today's Date: 09/24/2014   History of Present Illness  s/p ALIF S5/L1, repair L CIV secondary to progressive left calf weakness  Clinical Impression  Patient modified independent with all mobility, supervision with stair negotiation using rail.  Patient able to verbalize HEP for balance and strengthening.  Will have all needed assist upon discharge.  No need for PT follow up-patient in agreement.    Follow Up Recommendations No PT follow up;Supervision - Intermittent    Equipment Recommendations  None recommended by PT    Recommendations for Other Services       Precautions / Restrictions Precautions Precautions: Back Precaution Booklet Issued: Yes (comment) Precaution Comments: 3/3 precautions reviewed Required Braces or Orthoses: Spinal Brace Spinal Brace: Lumbar corset Restrictions Weight Bearing Restrictions: No      Mobility  Bed Mobility Overal bed mobility: Modified Independent Bed Mobility: Sit to Sidelying   Sidelying to sit: Supervision     Sit to sidelying: Modified independent (Device/Increase time) General bed mobility comments: educated on log rolling for bed mobility. Pt able to return demonstrate  Transfers Overall transfer level: Independent Equipment used: None             General transfer comment: patient standing in room upon arrival  Ambulation/Gait Ambulation/Gait assistance: Independent Ambulation Distance (Feet): 500 Feet Assistive device: None Gait Pattern/deviations: Decreased step length - right;Decreased stance time - left (decr push off left foot during terminal stance)        Stairs Stairs: Yes Stairs assistance: Supervision Stair Management: One rail Left;Forwards;Alternating pattern;Step to pattern (reciprocating pattern ascending, step to descending) Number of Stairs: 10 General stair comments: limited left  PF  Wheelchair Mobility    Modified Rankin (Stroke Patients Only)       Balance Overall balance assessment: Independent (tandem 30 sec, SLS 10 sec Lt)                                           Pertinent Vitals/Pain Pain Assessment: 0-10 Pain Score: 7  Pain Location: back Pain Descriptors / Indicators: Aching;Constant;Sore Pain Intervention(s): Limited activity within patient's tolerance;Monitored during session;Premedicated before session    [place] expects to be discharged to:: Private residence Living Arrangements: Spouse/significant other (wife works during day, 74 yo dau can assist) Available Help at Discharge: Available 24 hours/day Type of Home: House Home Access: Stairs to enter Entrance Stairs-Rails: Left Entrance Stairs-Number of Steps: [place]: Two level;Able to live on main level with bedroom/bathroom Home Equipment: None      Prior Function Level of Independence: Independent         Comments: retired     Engineer, [employer]   Dominant Hand: Right    Extremity/Trunk Assessment   Upper Extremity Assessment: Defer to OT evaluation           Lower Extremity Assessment: Overall WFL for tasks assessed      Cervical / Trunk Assessment: Normal  Communication   Communication: No difficulties  Cognition Arousal/Alertness: Awake/alert Behavior During Therapy: WFL for tasks assessed/performed Overall Cognitive Status: Within Functional Limits for tasks assessed                      General Comments      Exercises Other Exercises Other Exercises: tandem at counter (no UE support) Other Exercises:  SLS (no UE support) Other Exercises: heel raises (unable in standing, return demo in sitting) Other Exercises: runner's gastroc stretch Lt against counter      Assessment/Plan    PT Assessment [place] does not need any further PT services  PT Diagnosis Abnormality of gait   PT Problem List    PT  Treatment Interventions     PT Goals (Current goals can be found in the Care Plan section) Acute Rehab PT Goals Patient Stated Goal: to go home PT Goal Formulation: With patient Time For Goal Achievement: 09/25/14 Potential to Achieve Goals: Good    Frequency     Barriers to discharge        Co-evaluation               End of Session Equipment Utilized During Treatment: Back brace Activity Tolerance: Patient tolerated treatment well;Patient limited by pain Patient left: in bed;with call bell/phone within reach           Time: 1551-1619 PT Time Calculation (min) (ACUTE ONLY): 28 min   Charges:   PT Evaluation $Initial PT Evaluation Tier I: 1 Procedure PT Treatments $Gait Training: 8-22 mins   PT G CodesMalka So, PT [phone] [place] 09/24/2014, 4:28 PM

## 2014-09-24 NOTE — Progress Notes (Signed)
Foley removed at 6:30 AM, pt educated to void within 6 hours which will be 12:30PM.  Will continue to monitor.  Fredrich Romans, RN 09/24/2014 6:30AM

## 2014-09-25 DIAGNOSIS — M5489 Other dorsalgia: Secondary | ICD-10-CM | POA: Diagnosis not present

## 2014-09-25 DIAGNOSIS — M5127 Other intervertebral disc displacement, lumbosacral region: Secondary | ICD-10-CM | POA: Diagnosis not present

## 2014-09-25 MED ORDER — DIAZEPAM 5 MG PO TABS: 5.0000 mg | ORAL_TABLET | Freq: Four times a day (QID) | ORAL | Status: AC | PRN

## 2014-09-25 MED ORDER — OXYCODONE-ACETAMINOPHEN 5-325 MG PO TABS: 1.0000 | ORAL_TABLET | ORAL | Status: AC | PRN

## 2014-09-25 MED ORDER — ONDANSETRON HCL 4 MG PO TABS
4.0000 mg | ORAL_TABLET | Freq: Three times a day (TID) | ORAL | Status: DC | PRN
  Administered 2014-09-25: 4 mg via ORAL
  Filled 2014-09-25: qty 1

## 2014-09-25 MED ORDER — ONDANSETRON HCL 4 MG PO TABS: 4.0000 mg | ORAL_TABLET | Freq: Three times a day (TID) | ORAL | Status: AC | PRN

## 2014-09-25 NOTE — Progress Notes (Signed)
Patient ready for discharge to home; discharge instructions given and reviewed;Rx's given; patient discharged wearing his lumbar corset via wheelchair; accompanied out with his wife.

## 2014-09-25 NOTE — Discharge Summary (Signed)
Physician Discharge Summary  Patient ID: Leon Robinson MRN: [id number] DOB/AGE: Jan 27, 1956 59 y.o.  Admit date: 09/23/2014 Discharge date: 09/25/2014  Admission Diagnoses: Lumbar spondylosis L5-S1   Discharge Diagnoses: Same   Discharged Condition: good  Hospital Course: The patient was admitted on 09/23/2014 and taken to the operating room where the patient underwent ALIF L5-S1. The patient tolerated the procedure well and was taken to the recovery room and then to the floor in stable condition. The hospital course was routine. There were no complications. The wound remained clean dry and intact. Pt had appropriate back soreness. No complaints of leg pain or new N/T/W. The patient remained afebrile with stable vital signs, and tolerated a regular diet. The patient continued to increase activities, and pain was well controlled with oral pain medications.   Consults: None  Significant Diagnostic Studies:  Results for orders placed or performed during the hospital encounter of 09/15/14  Surgical pcr screen  Result Value Ref Range   MRSA, PCR NEGATIVE NEGATIVE   Staphylococcus aureus NEGATIVE NEGATIVE  Basic metabolic panel  Result Value Ref Range   Sodium 141 135 - 145 mmol/L   Potassium 4.3 3.5 - 5.1 mmol/L   Chloride 106 96 - 112 mmol/L   CO2 28 19 - 32 mmol/L   Glucose, Bld 101 (H) 70 - 99 mg/dL   BUN 20 6 - 23 mg/dL   Creatinine, Ser 1.12 0.50 - 1.35 mg/dL   Calcium 9.8 8.4 - 10.5 mg/dL   GFR calc non Af Amer 71 (L) >90 mL/min   GFR calc Af Amer 82 (L) >90 mL/min   Anion gap 7 5 - 15  CBC  Result Value Ref Range   WBC 4.9 4.0 - 10.5 K/uL   RBC 5.41 4.22 - 5.81 MIL/uL   Hemoglobin 15.8 13.0 - 17.0 g/dL   HCT 44.7 39.0 - 52.0 %   MCV 82.6 78.0 - 100.0 fL   MCH 29.2 26.0 - 34.0 pg   MCHC 35.3 30.0 - 36.0 g/dL   RDW 13.2 11.5 - 15.5 %   Platelets 149 (L) 150 - 400 K/uL  Type and screen  Result Value Ref Range   ABO/RH(D) A POS    Antibody Screen NEG    Sample  Expiration 09/29/2014   ABO/Rh  Result Value Ref Range   ABO/RH(D) A POS     Dg Lumbar Spine 2-3 Views  09/23/2014   CLINICAL DATA:  Lumbar fusion.  PLIF.  EXAM: DG C-ARM 61-120 MIN; LUMBAR SPINE - 2-3 VIEW  COMPARISON:  MRI 07/19/2014.  FINDINGS: AP and lateral intraoperative fluoroscopic spot films demonstrate postoperative changes of anterior and posterior fusion at L5-S1. Pedicle screws are present on the RIGHT.  IMPRESSION: L4-L5 anterior and posterior lumbar fusion.   Electronically Signed   By: Dereck Ligas M.D.   On: 09/23/2014 13:11   Dg C-arm 61-120 Min  09/23/2014   CLINICAL DATA:  Lumbar fusion.  PLIF.  EXAM: DG C-ARM 61-120 MIN; LUMBAR SPINE - 2-3 VIEW  COMPARISON:  MRI 07/19/2014.  FINDINGS: AP and lateral intraoperative fluoroscopic spot films demonstrate postoperative changes of anterior and posterior fusion at L5-S1. Pedicle screws are present on the RIGHT.  IMPRESSION: L4-L5 anterior and posterior lumbar fusion.   Electronically Signed   By: Dereck Ligas M.D.   On: 09/23/2014 13:11   Dg Or Local Abdomen  09/23/2014   CLINICAL DATA:  Post ALIF. Miscount.  Evaluate for body.  EXAM: OR LOCAL ABDOMEN  COMPARISON:  None.  FINDINGS: Postoperative change of L5-S1.  No radiopaque foreign body.  Moderate colonic stool burden.  IMPRESSION: Postoperative change of L5-S1.  No radiopaque foreign body.   Electronically Signed   By: Sandi Mariscal M.D.   On: 09/23/2014 11:22    Antibiotics:  Anti-infectives    Start     Dose/Rate Route Frequency Ordered Stop   09/23/14 1930  ceFAZolin (ANCEF) IVPB 1 g/50 mL premix     1 g 100 mL/hr over 30 Minutes Intravenous Every 8 hours 09/23/14 1530 09/24/14 0428   09/23/14 1545  ceFAZolin (ANCEF) IVPB 1 g/50 mL premix  Status:  Discontinued     1 g 100 mL/hr over 30 Minutes Intravenous Every 8 hours 09/23/14 1530 09/23/14 1535   09/23/14 0600  ceFAZolin (ANCEF) IVPB 2 g/50 mL premix     2 g 100 mL/hr over 30 Minutes Intravenous On call to O.R.  09/22/14 1413 09/23/14 1138      Discharge Exam: Blood pressure 131/70, pulse 80, temperature 98 F (36.7 C), temperature source Oral, resp. rate 17, height 6\' 5"  (1.956 m), weight 218 lb 4.1 oz (99 kg), SpO2 98 %. Neurologic: Grossly normal Dressings dry  Discharge Medications:     Medication List    TAKE these medications        aspirin 81 MG tablet  Take 81 mg by mouth daily.     atorvastatin 20 MG tablet  Commonly known as:  LIPITOR  Take 20 mg by mouth daily.     diazepam 5 MG tablet  Commonly known as:  VALIUM  Take 1 tablet (5 mg total) by mouth every 6 (six) hours as needed for muscle spasms.     DULoxetine 60 MG capsule  Commonly known as:  CYMBALTA  Take 60 mg by mouth daily.     ondansetron 4 MG tablet  Commonly known as:  ZOFRAN  Take 1 tablet (4 mg total) by mouth every 8 (eight) hours as needed for nausea or vomiting.     oxyCODONE-acetaminophen 5-325 MG per tablet  Commonly known as:  PERCOCET/ROXICET  Take 1-2 tablets by mouth every 4 (four) hours as needed for moderate pain.     RABEprazole 20 MG tablet  Commonly known as:  ACIPHEX  Take 20 mg by mouth daily.        Disposition: Home   Final Dx: ALIF L5-S1      Discharge Instructions    Call MD for:  difficulty breathing, headache or visual disturbances    Complete by:  As directed      Call MD for:  persistant nausea and vomiting    Complete by:  As directed      Call MD for:  redness, tenderness, or signs of infection (pain, swelling, redness, odor or green/yellow discharge around incision site)    Complete by:  As directed      Call MD for:  severe uncontrolled pain    Complete by:  As directed      Call MD for:  temperature >100.4    Complete by:  As directed      Diet - low sodium heart healthy    Complete by:  As directed      Discharge instructions    Complete by:  As directed   No driving, may shower, no strenuous activity, no bending or twisting     Increase activity slowly     Complete by:  As directed  Remove dressing in 48 hours    Complete by:  As directed            Follow-up Information    Follow up with STERN,JOSEPH D, MD In 2 weeks.   Specialty:  Neurosurgery   Contact information:   [address] [place] [address] [phone]        Signed: Eustace Moore 09/25/2014, 11:32 AM

## 2014-09-26 ENCOUNTER — Encounter (HOSPITAL_COMMUNITY): Payer: Self-pay | Admitting: Neurosurgery

## 2014-09-27 ENCOUNTER — Encounter (HOSPITAL_COMMUNITY): Payer: Self-pay | Admitting: Neurosurgery

## 2014-09-27 MED FILL — Heparin Sodium (Porcine) Inj 1000 Unit/ML: INTRAMUSCULAR | Qty: 30 | Status: AC

## 2014-09-27 MED FILL — Sodium Chloride IV Soln 0.9%: INTRAVENOUS | Qty: 1000 | Status: AC

## 2014-09-28 NOTE — Addendum Note (Signed)
Addendum  created 09/28/14 1850 by Finis Bud, MD   Modules edited: Anesthesia Attestations

## 2016-08-08 IMAGING — CR DG OR LOCAL ABDOMEN
1 series · 1 of 1 positions shown · non-contrast
Comparison: None.

CLINICAL DATA: Post ALIF. Miscount.  Evaluate for body.

EXAM:
OR LOCAL ABDOMEN

[supine ap]
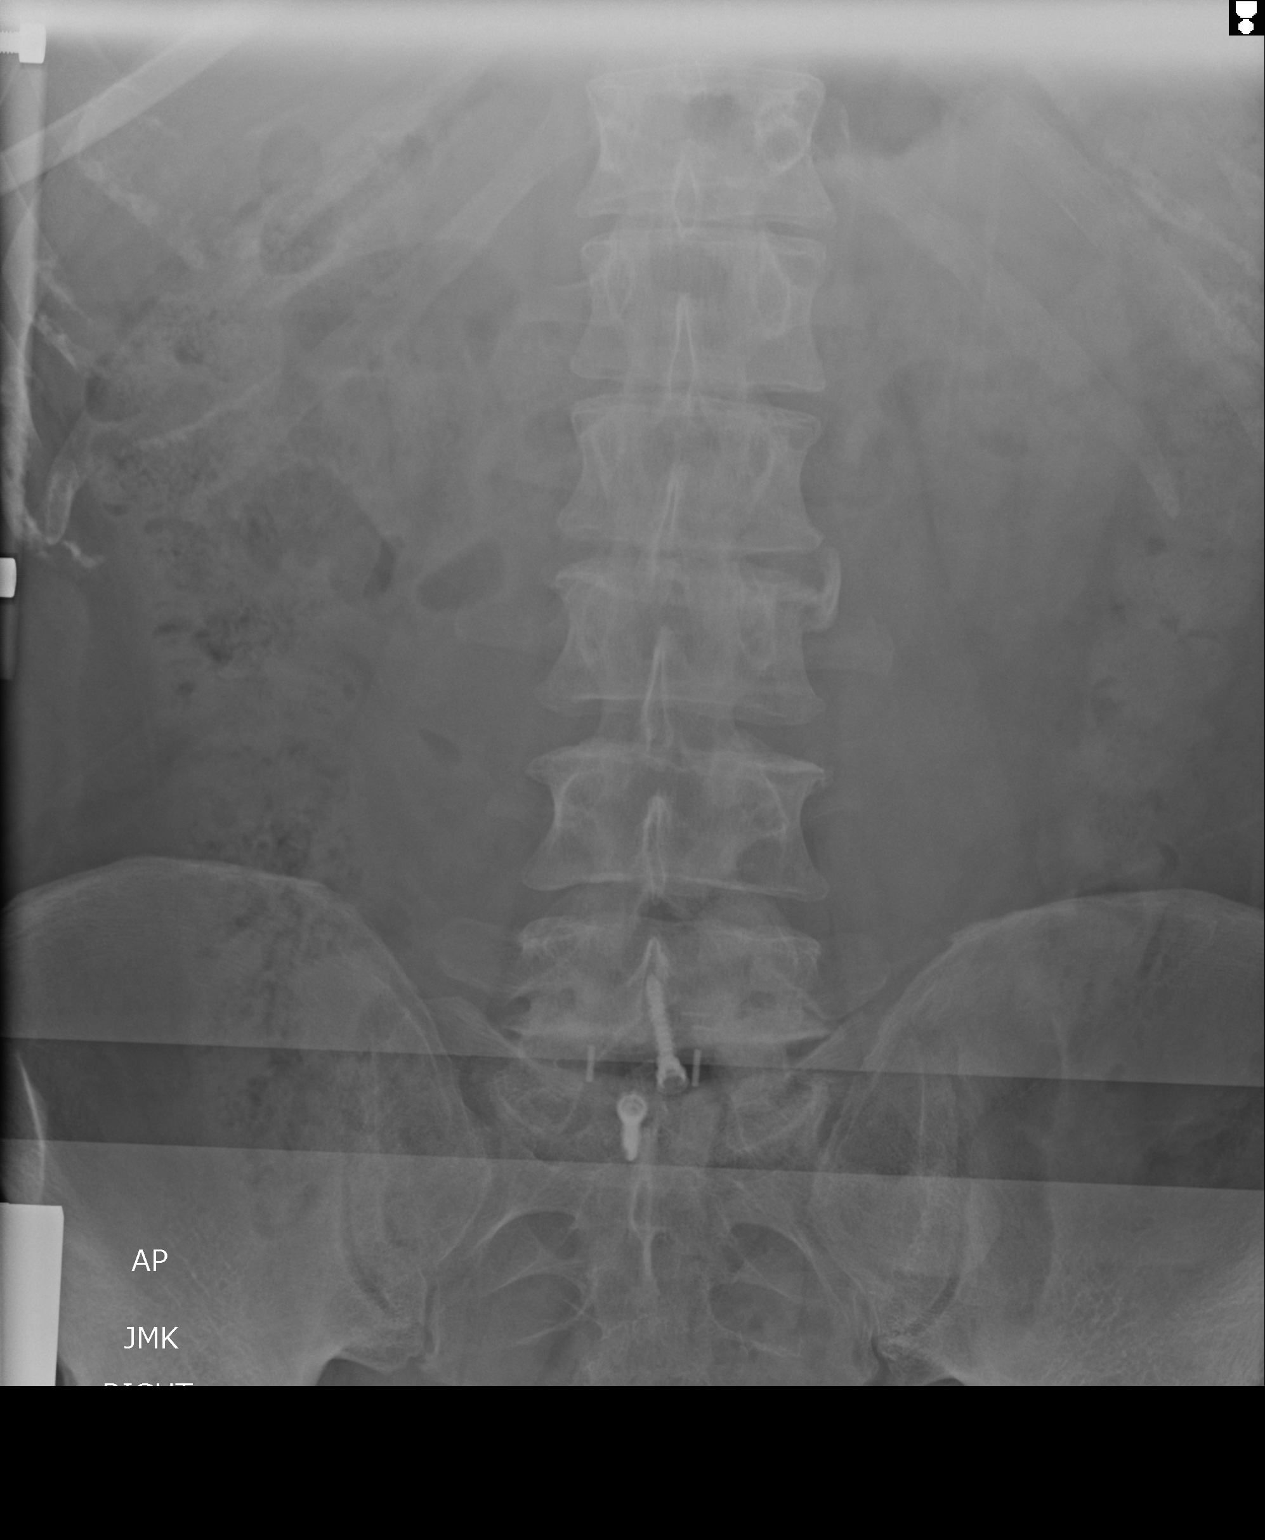

[1 of 1 positions shown; findings below may reference images not displayed]

FINDINGS: Postoperative change of L5-S1.

No radiopaque foreign body.

Moderate colonic stool burden.
IMPRESSION: Postoperative change of L5-S1.  No radiopaque foreign body.
